# Patient Record
Sex: Male | Born: 1950 | Race: Black or African American | Hispanic: No | Marital: Single | Smoking: Former smoker
Health system: Southern US, Community
[De-identification: ages and names within clinical notes are randomized; demographics above are authoritative.]

## PROBLEM LIST (undated history)

## (undated) DIAGNOSIS — N529 Male erectile dysfunction, unspecified: Secondary | ICD-10-CM

## (undated) DIAGNOSIS — F32A Depression, unspecified: Secondary | ICD-10-CM

## (undated) DIAGNOSIS — K219 Gastro-esophageal reflux disease without esophagitis: Secondary | ICD-10-CM

## (undated) DIAGNOSIS — I10 Essential (primary) hypertension: Secondary | ICD-10-CM

## (undated) DIAGNOSIS — Z720 Tobacco use: Secondary | ICD-10-CM

## (undated) DIAGNOSIS — E785 Hyperlipidemia, unspecified: Secondary | ICD-10-CM

## (undated) DIAGNOSIS — F1021 Alcohol dependence, in remission: Secondary | ICD-10-CM

## (undated) DIAGNOSIS — M1990 Unspecified osteoarthritis, unspecified site: Secondary | ICD-10-CM

## (undated) DIAGNOSIS — K429 Umbilical hernia without obstruction or gangrene: Secondary | ICD-10-CM

## (undated) HISTORY — DX: Unspecified osteoarthritis, unspecified site: M19.90

## (undated) HISTORY — PX: BRAIN SURGERY: SHX531

## (undated) HISTORY — DX: Alcohol dependence, in remission: F10.21

## (undated) HISTORY — DX: Hyperlipidemia, unspecified: E78.5

## (undated) HISTORY — DX: Umbilical hernia without obstruction or gangrene: K42.9

## (undated) HISTORY — DX: Male erectile dysfunction, unspecified: N52.9

## (undated) HISTORY — DX: Depression, unspecified: F32.A

## (undated) HISTORY — DX: Tobacco use: Z72.0

---

## 1983-01-06 DIAGNOSIS — Z87898 Personal history of other specified conditions: Secondary | ICD-10-CM

## 1983-01-06 DIAGNOSIS — R569 Unspecified convulsions: Secondary | ICD-10-CM

## 1983-01-06 HISTORY — DX: Unspecified convulsions: R56.9

## 2004-01-30 ENCOUNTER — Emergency Department: Payer: Self-pay | Admitting: Emergency Medicine

## 2005-06-01 ENCOUNTER — Emergency Department: Payer: Self-pay | Admitting: Emergency Medicine

## 2005-10-08 ENCOUNTER — Emergency Department: Payer: Self-pay | Admitting: Emergency Medicine

## 2006-05-03 ENCOUNTER — Emergency Department: Payer: Self-pay | Admitting: Unknown Physician Specialty

## 2007-04-18 ENCOUNTER — Other Ambulatory Visit: Payer: Self-pay

## 2007-04-18 ENCOUNTER — Emergency Department: Payer: Self-pay | Admitting: Emergency Medicine

## 2008-02-01 ENCOUNTER — Emergency Department: Payer: Self-pay | Admitting: Internal Medicine

## 2008-11-20 ENCOUNTER — Emergency Department: Payer: Self-pay | Admitting: Emergency Medicine

## 2012-11-24 ENCOUNTER — Emergency Department: Payer: Self-pay | Admitting: Emergency Medicine

## 2012-11-24 LAB — COMPREHENSIVE METABOLIC PANEL
Albumin: 3.6 g/dL (ref 3.4–5.0)
Alkaline Phosphatase: 110 U/L (ref 50–136)
Anion Gap: 5 — ABNORMAL LOW (ref 7–16)
BUN: 31 mg/dL — ABNORMAL HIGH (ref 7–18)
Bilirubin,Total: 0.3 mg/dL (ref 0.2–1.0)
Calcium, Total: 9.1 mg/dL (ref 8.5–10.1)
Chloride: 93 mmol/L — ABNORMAL LOW (ref 98–107)
Co2: 32 mmol/L (ref 21–32)
Creatinine: 1.62 mg/dL — ABNORMAL HIGH (ref 0.60–1.30)
EGFR (African American): 52 — ABNORMAL LOW
EGFR (Non-African Amer.): 45 — ABNORMAL LOW
Glucose: 110 mg/dL — ABNORMAL HIGH (ref 65–99)
Osmolality: 268 (ref 275–301)
Potassium: 3 mmol/L — ABNORMAL LOW (ref 3.5–5.1)
SGOT(AST): 28 U/L (ref 15–37)
SGPT (ALT): 23 U/L (ref 12–78)
Sodium: 130 mmol/L — ABNORMAL LOW (ref 136–145)
Total Protein: 8.3 g/dL — ABNORMAL HIGH (ref 6.4–8.2)

## 2012-11-24 LAB — CBC WITH DIFFERENTIAL/PLATELET
Basophil #: 0.1 10*3/uL (ref 0.0–0.1)
Basophil %: 0.8 %
Eosinophil #: 0.3 10*3/uL (ref 0.0–0.7)
Eosinophil %: 4 %
HCT: 43.2 % (ref 40.0–52.0)
HGB: 15.2 g/dL (ref 13.0–18.0)
Lymphocyte #: 1.5 10*3/uL (ref 1.0–3.6)
Lymphocyte %: 23 %
MCH: 33 pg (ref 26.0–34.0)
MCHC: 35.2 g/dL (ref 32.0–36.0)
MCV: 94 fL (ref 80–100)
Monocyte #: 1 x10 3/mm (ref 0.2–1.0)
Monocyte %: 14.9 %
Neutrophil #: 3.8 10*3/uL (ref 1.4–6.5)
Neutrophil %: 57.3 %
Platelet: 318 10*3/uL (ref 150–440)
RBC: 4.6 10*6/uL (ref 4.40–5.90)
RDW: 13 % (ref 11.5–14.5)
WBC: 6.7 10*3/uL (ref 3.8–10.6)

## 2012-11-24 LAB — LIPASE, BLOOD: Lipase: 357 U/L (ref 73–393)

## 2013-09-08 ENCOUNTER — Emergency Department: Payer: Self-pay | Admitting: Emergency Medicine

## 2016-07-16 ENCOUNTER — Encounter: Payer: Self-pay | Admitting: Emergency Medicine

## 2016-07-16 ENCOUNTER — Emergency Department
Admission: EM | Admit: 2016-07-16 | Discharge: 2016-07-16 | Disposition: A | Discharge status: Home / Self Care | Payer: Medicare Other | Attending: Emergency Medicine | Admitting: Emergency Medicine

## 2016-07-16 DIAGNOSIS — M545 Low back pain: Secondary | ICD-10-CM | POA: Diagnosis present

## 2016-07-16 DIAGNOSIS — I10 Essential (primary) hypertension: Secondary | ICD-10-CM | POA: Diagnosis not present

## 2016-07-16 DIAGNOSIS — M5441 Lumbago with sciatica, right side: Secondary | ICD-10-CM | POA: Insufficient documentation

## 2016-07-16 DIAGNOSIS — M5431 Sciatica, right side: Secondary | ICD-10-CM

## 2016-07-16 HISTORY — DX: Essential (primary) hypertension: I10

## 2016-07-16 HISTORY — DX: Gastro-esophageal reflux disease without esophagitis: K21.9

## 2016-07-16 MED ORDER — PREDNISONE 10 MG PO TABS: ORAL_TABLET | ORAL | 0 refills | Status: DC

## 2016-07-16 MED ORDER — TRAMADOL HCL 50 MG PO TABS: 50.0000 mg | ORAL_TABLET | Freq: Four times a day (QID) | ORAL | 0 refills | Status: AC | PRN

## 2016-07-16 NOTE — ED Triage Notes (Signed)
Pt reports right side low back pain that radiates down right leg for six days. Pt ambulatory to triage. Denies NVD. Denies dysuria.

## 2016-07-16 NOTE — Discharge Instructions (Signed)
Have blood pressure rechecked at [facility].   Begin taking prednisone as directed.  Tramadol as needed for moderate to severe pain.  Do not drive while taking Tramadol.  You may also use ice or heat to your back for comfort

## 2016-07-16 NOTE — ED Provider Notes (Signed)
[facility] Emergency Department Provider Note   ____________________________________________   First MD Initiated Contact with Patient 07/16/16 1659     (approximate)  I have reviewed the triage vital signs and the nursing notes.   HISTORY  Chief Complaint Back Pain    HPI Drew Mendoza is a 66 y.o. male is here with complaint of right-sided low back pain that radiates down into his right leg for the last 6 days. Patient states he has had problems with his back in the past but has never been diagnosed with sciatica. He denies any urinary symptoms, incontinence of bowel or bladder, saddle anesthesias. Patient has not been taking any over-the-counter medication. She did not go to work today due to his right leg pain. He rates his pain as 7 out of 10.   Past Medical History:  Diagnosis Date  . GERD (gastroesophageal reflux disease)   . Hypertension     There are no active problems to display for this patient.   No past surgical history on file.  Prior to Admission medications   Medication Sig Start Date End Date Taking? Authorizing Provider  predniSONE (DELTASONE) 10 MG tablet Take 6 tablets  today, on day 2 take 5 tablets, day 3 take 4 tablets, day 4 take 3 tablets, day 5 take  2 tablets and 1 tablet the last day 07/16/16   Tommi Rumps, PA-C  traMADol (ULTRAM) 50 MG tablet Take 1 tablet (50 mg total) by mouth every 6 (six) hours as needed. 07/16/16 07/16/17  Tommi Rumps, PA-C    Allergies Patient has no known allergies.  No family history on file.  Social History Social History  Substance Use Topics  . Smoking status: Not on file  . Smokeless tobacco: Not on file  . Alcohol use Not on file    Review of Systems Constitutional: No fever/chills Cardiovascular: Denies chest pain. Respiratory: Denies shortness of breath. Gastrointestinal: No abdominal pain.  No nausea, no vomiting. Genitourinary: Negative for  dysuria. Musculoskeletal: Positive for right lower back pain. Positive for radiculopathy right leg. Skin: Negative for rash. Neurological: Negative for headaches, focal weakness or numbness.   ____________________________________________   PHYSICAL EXAM:  VITAL SIGNS: ED Triage Vitals [07/16/16 1634]  Enc Vitals Group     BP (!) 142/101     Pulse Rate 82     Resp 18     Temp 98.1 F (36.7 C)     Temp Source Oral     SpO2 97 %     Weight 140 lb (63.5 kg)     Height 5\' 9"  (1.753 m)     Head Circumference      Peak Flow      Pain Score 7     Pain Loc      Pain Edu?      Excl. in GC?     Constitutional: Alert and oriented. Well appearing and in no acute distress. Eyes: Conjunctivae are normal.  Head: Atraumatic. Neck: No stridor.   Cardiovascular: Normal rate, regular rhythm. Grossly normal heart sounds.  Good peripheral circulation. Respiratory: Normal respiratory effort.  No retractions. Lungs CTAB. Gastrointestinal: Soft and nontender. No distention. Musculoskeletal: On examination back there is no gross deformity noted. There is no tenderness on palpation of the vertebral bodies of the lumbar spine. There is moderate tenderness on palpation of the right SI joint area. Straight leg raises were negative. Normal gait was noted. Neurologic:  Normal speech and language. No  gross focal neurologic deficits are appreciated. No gait instability. Reflexes were 1+ bilaterally. Skin:  Skin is warm, dry and intact. No rash noted. Psychiatric: Mood and affect are normal. Speech and behavior are normal.  ____________________________________________   LABS (all labs ordered are listed, but only abnormal results are displayed)  Labs Reviewed - No data to display  PROCEDURES  Procedure(s) performed: None  Procedures  Critical Care performed: No  ____________________________________________   INITIAL IMPRESSION / ASSESSMENT AND PLAN / ED COURSE  Pertinent labs & imaging  results that were available during my care of the patient were reviewed by me and considered in my medical decision making (see chart for details).  Patient's blood pressure was elevated and follow in the department. Patient states that he has not taken his blood pressure medication in approximately 1 month. He [facility] all [facility]. He is encouraged to [facility] and get his medication refilled. He was given a prescription for prednisone 60 a taper along with tramadol if needed for severe pain.      ____________________________________________   FINAL CLINICAL IMPRESSION(S) / ED DIAGNOSES  Final diagnoses:  Sciatica of right side      NEW MEDICATIONS STARTED DURING THIS VISIT:  Discharge Medication List as of 07/16/2016  5:30 PM    START taking these medications   Details  predniSONE (DELTASONE) 10 MG tablet Take 6 tablets  today, on day 2 take 5 tablets, day 3 take 4 tablets, day 4 take 3 tablets, day 5 take  2 tablets and 1 tablet the last day, Print    traMADol (ULTRAM) 50 MG tablet Take 1 tablet (50 mg total) by mouth every 6 (six) hours as needed., Starting Thu 07/16/2016, Until Fri 07/16/2017, Print         Note:  This document was prepared using Dragon voice recognition software and may include unintentional dictation errors.    Tommi RumpsSummers, Sequoyah Ramone L, PA-C 07/16/16 1743    Charlynne PanderYao, David Hsienta, MD 07/16/16 2053

## 2018-08-23 ENCOUNTER — Other Ambulatory Visit: Payer: Self-pay

## 2018-08-23 DIAGNOSIS — Z20822 Contact with and (suspected) exposure to covid-19: Secondary | ICD-10-CM

## 2018-08-25 LAB — NOVEL CORONAVIRUS, NAA: SARS-CoV-2, NAA: NOT DETECTED

## 2019-02-07 ENCOUNTER — Emergency Department: Payer: No Typology Code available for payment source

## 2019-02-07 ENCOUNTER — Emergency Department
Admission: EM | Admit: 2019-02-07 | Discharge: 2019-02-07 | Disposition: A | Discharge status: Home / Self Care | Payer: No Typology Code available for payment source | Attending: Emergency Medicine | Admitting: Emergency Medicine

## 2019-02-07 ENCOUNTER — Encounter: Payer: Self-pay | Admitting: Emergency Medicine

## 2019-02-07 ENCOUNTER — Other Ambulatory Visit: Payer: Self-pay

## 2019-02-07 DIAGNOSIS — W06XXXA Fall from bed, initial encounter: Secondary | ICD-10-CM | POA: Diagnosis not present

## 2019-02-07 DIAGNOSIS — F17200 Nicotine dependence, unspecified, uncomplicated: Secondary | ICD-10-CM | POA: Insufficient documentation

## 2019-02-07 DIAGNOSIS — I10 Essential (primary) hypertension: Secondary | ICD-10-CM | POA: Insufficient documentation

## 2019-02-07 DIAGNOSIS — S0003XA Contusion of scalp, initial encounter: Secondary | ICD-10-CM

## 2019-02-07 DIAGNOSIS — S42032A Displaced fracture of lateral end of left clavicle, initial encounter for closed fracture: Secondary | ICD-10-CM | POA: Insufficient documentation

## 2019-02-07 DIAGNOSIS — Y999 Unspecified external cause status: Secondary | ICD-10-CM | POA: Insufficient documentation

## 2019-02-07 DIAGNOSIS — Y939 Activity, unspecified: Secondary | ICD-10-CM | POA: Diagnosis not present

## 2019-02-07 DIAGNOSIS — Y92003 Bedroom of unspecified non-institutional (private) residence as the place of occurrence of the external cause: Secondary | ICD-10-CM | POA: Diagnosis not present

## 2019-02-07 DIAGNOSIS — S0990XA Unspecified injury of head, initial encounter: Secondary | ICD-10-CM | POA: Diagnosis not present

## 2019-02-07 MED ORDER — TRAMADOL HCL 50 MG PO TABS: 50.0000 mg | ORAL_TABLET | Freq: Four times a day (QID) | ORAL | 0 refills | Status: DC | PRN

## 2019-02-07 NOTE — ED Provider Notes (Signed)
[facility] Emergency Department Provider Note   ____________________________________________   First MD Initiated Contact with Patient 02/07/19 [phone]     (approximate)  I have reviewed the triage vital signs and the nursing notes.   HISTORY  Chief Complaint Shoulder Injury   HPI Drew Mendoza is a 69 y.o. male presents to the ED with complaint of left shoulder pain and injury to his head.  Patient states that he fell out of bed 2 nights ago.  This was unwitnessed however he states his daughter was there and heard him fall.  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  He denies any loss of consciousness or vision difficulty.  He has continued to have pain in his left shoulder.  Patient denies any blood thinners or daily aspirin therapy.  Patient has a history of brain surgery in the 1980s.  He rates his pain as an 8 out of 10.      Past Medical History:  Diagnosis Date  . GERD (gastroesophageal reflux disease)   . Hypertension     There are no problems to display for this patient.   Past Surgical History:  Procedure Laterality Date  . BRAIN SURGERY      Prior to Admission medications   Medication Sig Start Date End Date Taking? Authorizing Provider  traMADol (ULTRAM) 50 MG tablet Take 1 tablet (50 mg total) by mouth every 6 (six) hours as needed. 02/07/19   Tommi Rumps, PA-C    Allergies Patient has no known allergies.  No family history on file.  Social History Social History   Tobacco Use  . Smoking status: Current Every Day Smoker  . Smokeless tobacco: Never Used  Substance Use Topics  . Alcohol use: Yes    Comment: rarely  . Drug use: Not on file    Review of Systems Constitutional: No fever/chills Eyes: No visual changes. ENT: No trauma. Cardiovascular: Denies chest pain. Respiratory: Denies shortness of breath. Gastrointestinal: No abdominal  pain.  No nausea, no vomiting.  No diarrhea.  No constipation. Musculoskeletal: Positive for pain left shoulder. Skin: Negative for rash. Neurological: Positive mild headaches, no focal weakness or numbness. ____________________________________________   PHYSICAL EXAM:  VITAL SIGNS: ED Triage Vitals  Enc Vitals Group     BP 02/07/19 0812 140/86     Pulse Rate 02/07/19 0812 88     Resp 02/07/19 0812 18     Temp 02/07/19 0812 (!) 97.4 F (36.3 C)     Temp Source 02/07/19 0812 Oral     SpO2 02/07/19 0812 98 %     Weight 02/07/19 0815 149 lb (67.6 kg)     Height 02/07/19 0815 5\' 9"  (1.753 m)     Head Circumference --      Peak Flow --      Pain Score 02/07/19 0815 8     Pain Loc --      Pain Edu? --      Excl. in GC? --     Constitutional: Alert and oriented. Well appearing and in no acute distress. Eyes: Conjunctivae are normal. PERRL. EOMI. Head: Atraumatic. Nose: No trauma. Neck: No stridor.  Nontender cervical spine post to palpation.  Range of motion is without restriction. Cardiovascular: Normal rate, regular rhythm. Grossly normal heart sounds.  Good peripheral circulation. Respiratory: Normal respiratory effort.  No retractions. Lungs CTAB. Gastrointestinal: Soft and nontender.  No distention. Musculoskeletal: No gross deformities noted of the left shoulder however patient is moderately tender to palpation on the distal portion of his clavicle.  Also range of motion of left shoulder is restricted secondary to pain.  No soft tissue edema or discoloration is noted.  Good muscle strength bilateral hands.  No tenderness noted to the left elbow or wrist.  Patient also has no tenderness or difficulty with lower extremities. Neurologic:  Normal speech and language. No gross focal neurologic deficits are appreciated. No gait instability. Skin:  Skin is warm, dry and intact.  There is a well-healed surgical scar upper forehead from his previous brain surgery.  No acute skin injury is  noted. Psychiatric: Mood and affect are normal. Speech and behavior are normal.  ____________________________________________   LABS (all labs ordered are listed, but only abnormal results are displayed)  Labs Reviewed - No data to display  RADIOLOGY  Official radiology report(s): CT Head Wo Contrast  Result Date: 02/07/2019 CLINICAL DATA:  Fall from bed, history of brain surgery EXAM: CT HEAD WITHOUT CONTRAST TECHNIQUE: Contiguous axial images were obtained from the base of the skull through the vertex without intravenous contrast. COMPARISON:  None. FINDINGS: Brain: There is no acute intracranial hemorrhage, mass-effect, or edema. There is no acute appearing loss of gray differentiation. Left frontal encephalomalacia is noted. Patchy and confluent hypoattenuation in the supratentorial white matter is nonspecific but may reflect moderate chronic microvascular ischemic changes. There is no extra-axial fluid collection. Ventricles and sulci are within normal limits in size and configuration. Vascular: There is mild atherosclerotic calcification at the skull base. Skull: Bifrontal craniotomy. Sinuses/Orbits: Mild mucosal thickening.  Orbits are unremarkable. Other: Mastoid air cells are clear. Presumed cerumen within bilateral external auditory canals. IMPRESSION: No evidence of acute intracranial injury. Chronic/nonemergent findings detailed above. Electronically Signed   By: Guadlupe Spanish M.D.   On: 02/07/2019 08:54   DG Shoulder Left  Result Date: 02/07/2019 CLINICAL DATA:  Fall out of bed EXAM: LEFT SHOULDER - 2+ VIEW COMPARISON:  None. FINDINGS: Mildly displaced fracture of the distal left clavicle. There is anatomic alignment at the glenohumeral joint. Joint spaces are preserved. IMPRESSION: Mildly displaced fracture of the distal left clavicle. Electronically Signed   By: Guadlupe Spanish M.D.   On: 02/07/2019 08:45     ____________________________________________   PROCEDURES  Procedure(s) performed (including Critical Care):  Procedures   ____________________________________________   INITIAL IMPRESSION / ASSESSMENT AND PLAN / ED COURSE  As part of my medical decision making, I reviewed the following data within the electronic MEDICAL RECORD NUMBER Notes from prior ED visits and [place] Controlled Substance Database  ASEEM SESSUMS was evaluated in Emergency Department on 02/07/2019 for the symptoms described in the history of present illness. He was evaluated in the context of the global COVID-19 pandemic, which necessitated consideration that the patient might be at risk for infection with the SARS-CoV-2 virus that causes COVID-19. Institutional protocols and algorithms that pertain to the evaluation of patients at risk for COVID-19 are in a state of rapid change based on information released by regulatory bodies including the CDC and federal and state organizations. These policies and algorithms were followed during the patient's care in the ED.   69 year old male presents to the ED with complaint of head contusion and left shoulder pain after he fell out of bed 2 days ago.  Patient states that this was because of bad dreams secondary to being in the [employer].  He denies any  loss of consciousness however this was an unwitnessed event.  Physical exam is suspicious for a shoulder injury.  X-rays confirmed that he does have a distal clavicle fracture and patient was made aware.  His CT scan was negative for any acute changes.  Patient was given a prescription for tramadol.  He states that he will go to the [facility] hospital for follow-up on his clavicle fracture.  He was placed in a sling and was ambulatory at the time of discharge.  ____________________________________________   FINAL CLINICAL IMPRESSION(S) / ED DIAGNOSES  Final diagnoses:  Closed displaced fracture of acromial end of left clavicle, initial  encounter  Contusion of scalp, initial encounter     ED Discharge Orders         Ordered    traMADol (ULTRAM) 50 MG tablet  Every 6 hours PRN     02/07/19 1694           Note:  This document was prepared using Dragon voice recognition software and may include unintentional dictation errors.    Johnn Hai, PA-C 02/07/19 1107    Lavonia Drafts, MD 02/07/19 [phone]

## 2019-02-07 NOTE — ED Notes (Signed)
See triage note  States he had a "bad 'dream and fell from bed  He landed on left shoulder    States he also hit his head on hardwood floor  Having increased pain with movement of arm

## 2019-02-07 NOTE — Discharge Instructions (Addendum)
Call make an appointment with one of the orthopedist at the [facility] for follow-up of your clavicle fracture.  The sling is provided for support.  You do not need to wear this while you are sleeping.  You may apply ice to the area as needed for swelling or pain.  Also take tramadol every 6 hours if needed for pain.  You may take Tylenol with t with the tramadol if additional pain medication is needed.  CT of your head did not show any acute brain injury.  Return to the emergency department if any urgent concerns.

## 2019-02-07 NOTE — ED Triage Notes (Signed)
Presents s/p fall from bed 2 nights ago  Having pain to left shoulder and also hit his head   No LOC

## 2020-08-02 IMAGING — CR DG SHOULDER 2+V*L*
1 series · 3 of 3 positions shown · non-contrast
Comparison: None.

CLINICAL DATA: Fall out of bed

EXAM:
LEFT SHOULDER - 2+ VIEW

[Series 1: dg shoulder left · 0.14mm/px · 3 of 3 slices shown]
[im 1/3]
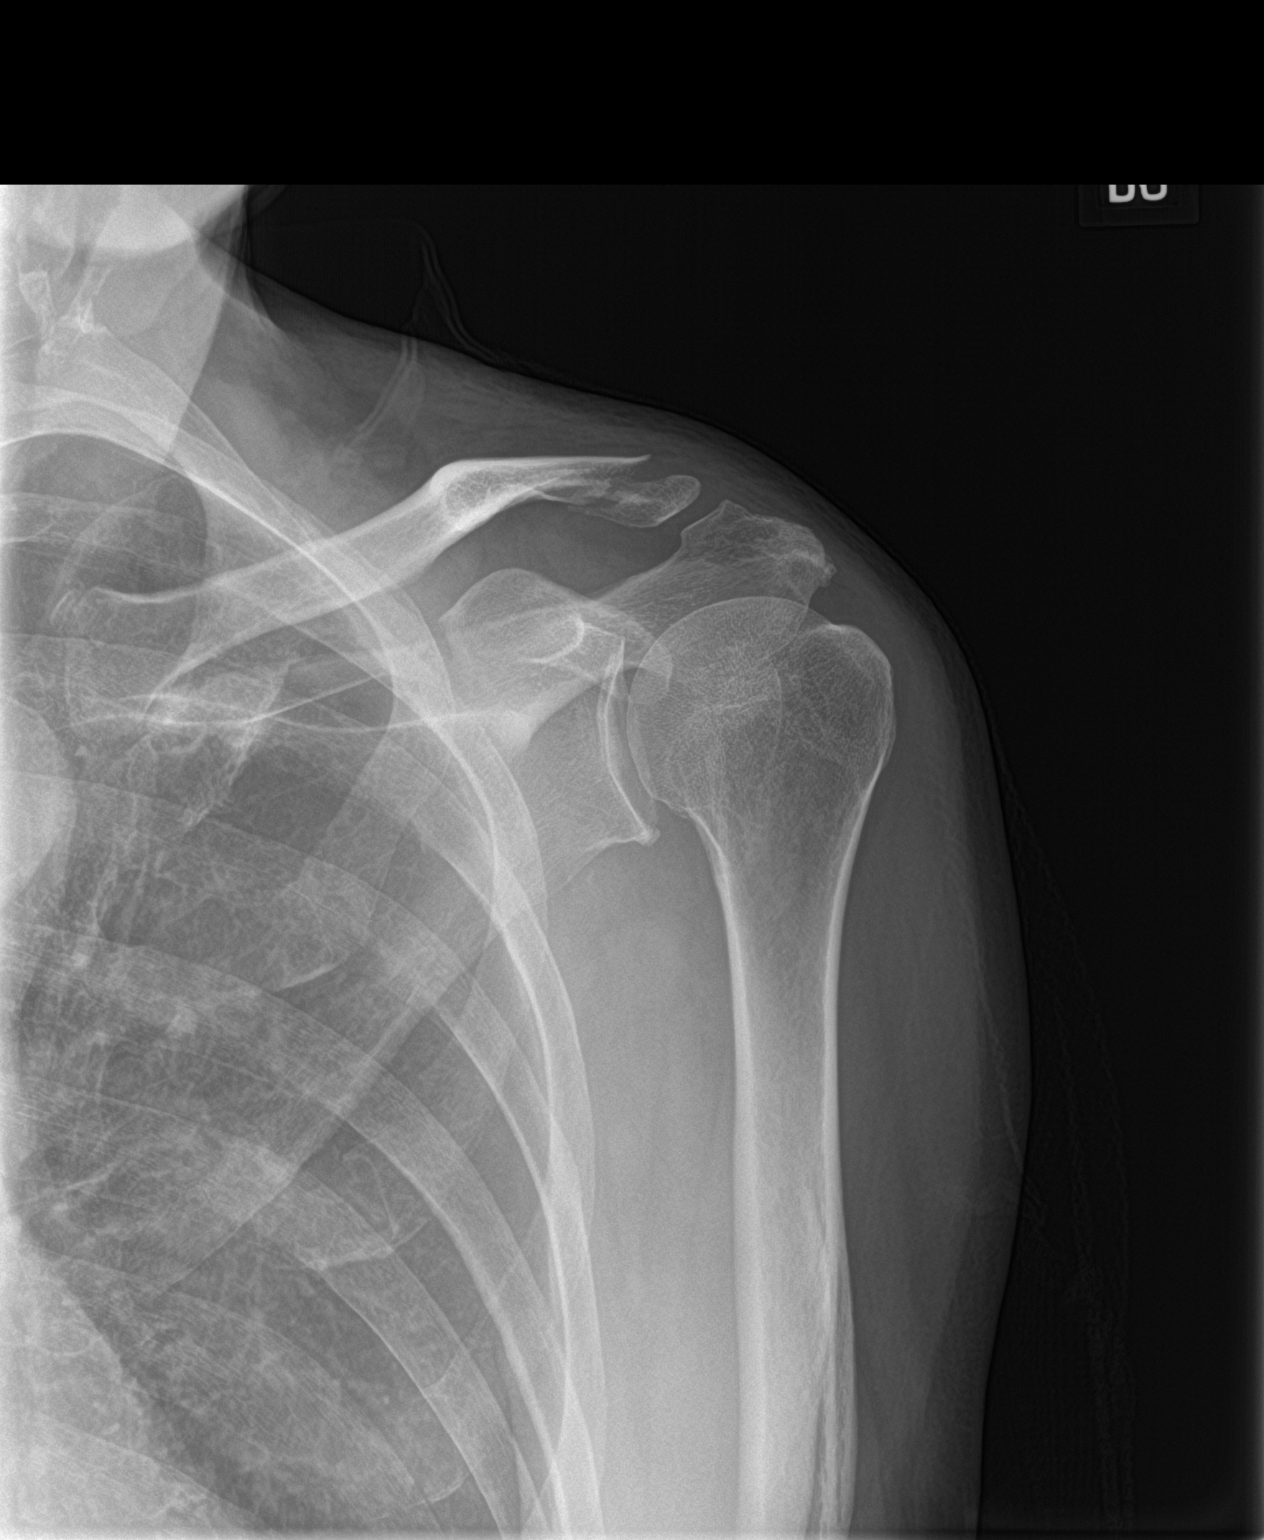
[im 2/3]
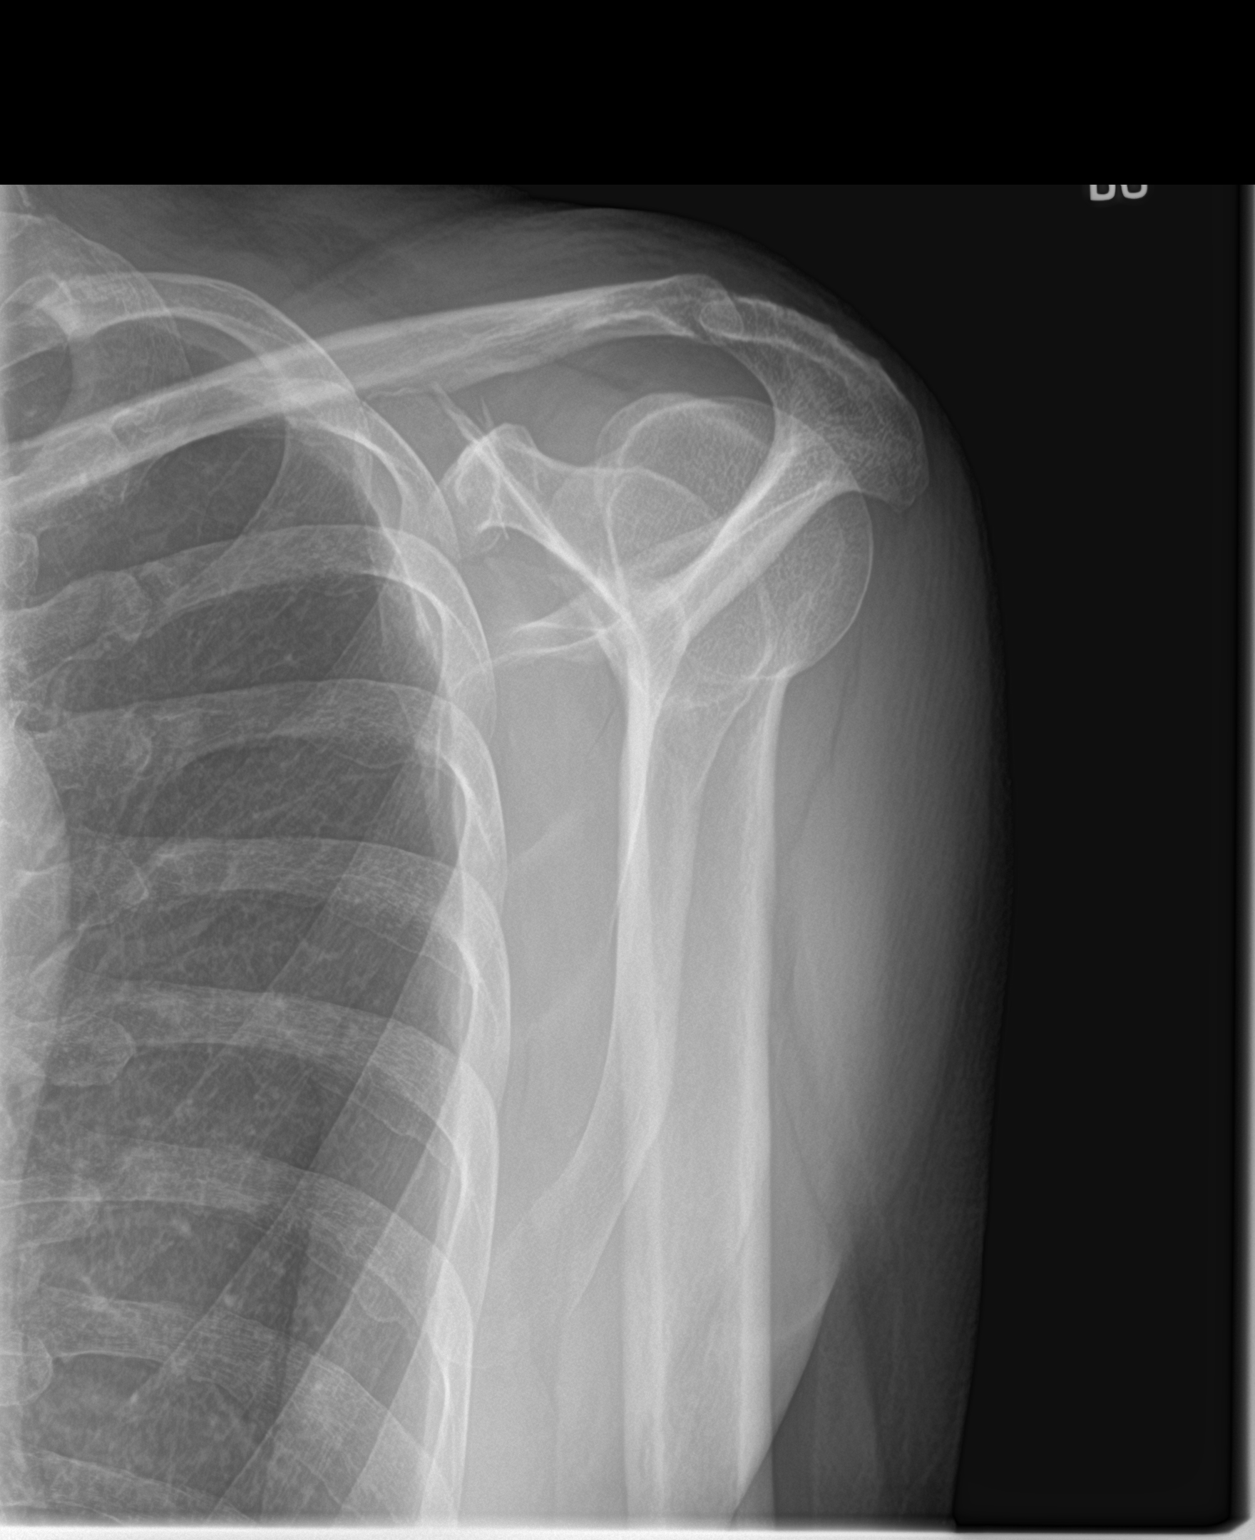
[im 3/3]
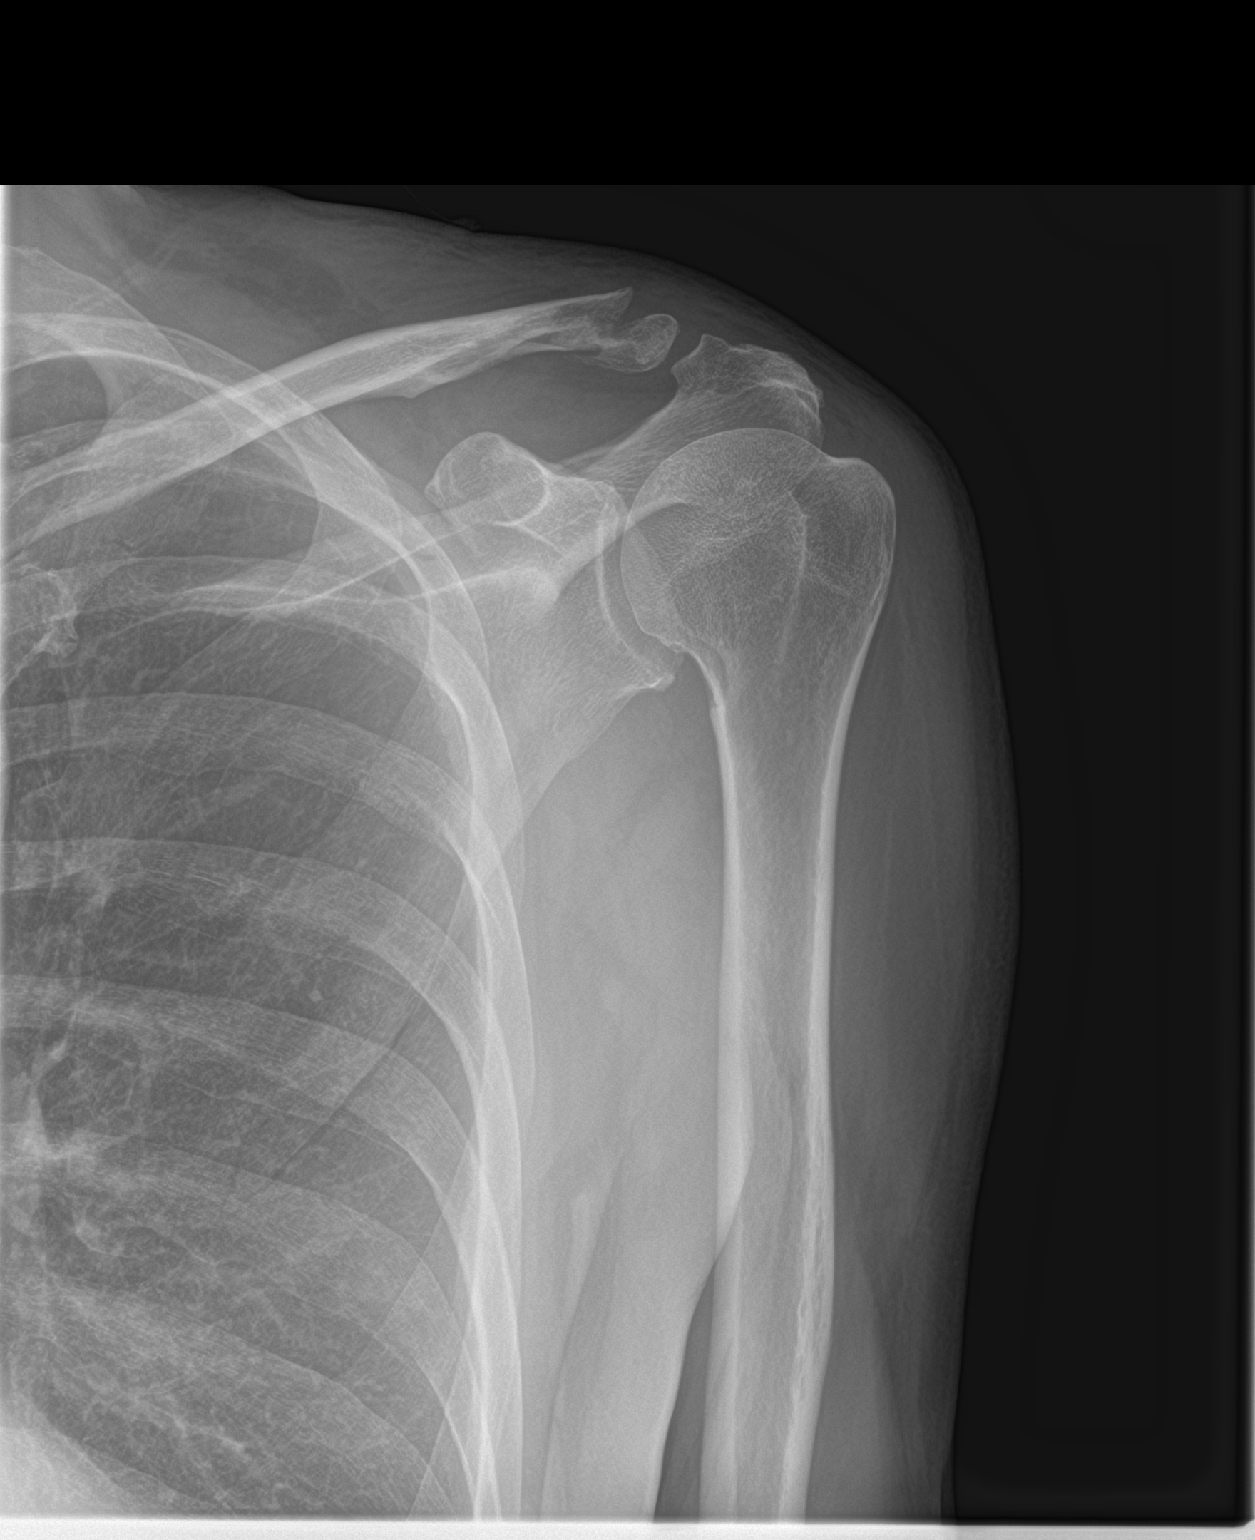

[3 of 3 positions shown; findings below may reference images not displayed]

FINDINGS: Mildly displaced fracture of the distal left clavicle. There is
anatomic alignment at the glenohumeral joint. Joint spaces are
preserved.
IMPRESSION: Mildly displaced fracture of the distal left clavicle.

## 2021-09-12 DIAGNOSIS — H25042 Posterior subcapsular polar age-related cataract, left eye: Secondary | ICD-10-CM | POA: Diagnosis not present

## 2021-10-01 DIAGNOSIS — H2511 Age-related nuclear cataract, right eye: Secondary | ICD-10-CM | POA: Diagnosis not present

## 2021-10-23 ENCOUNTER — Encounter: Payer: Self-pay | Admitting: Ophthalmology

## 2021-10-27 NOTE — Discharge Instructions (Signed)

## 2021-10-28 ENCOUNTER — Encounter: Payer: Self-pay | Admitting: Ophthalmology

## 2021-10-28 ENCOUNTER — Other Ambulatory Visit: Payer: Self-pay

## 2021-10-28 ENCOUNTER — Ambulatory Visit
Admission: RE | Admit: 2021-10-28 | Discharge: 2021-10-28 | Disposition: A | Discharge status: Home / Self Care | Payer: Medicare Other | Attending: Ophthalmology | Admitting: Ophthalmology

## 2021-10-28 ENCOUNTER — Ambulatory Visit: Payer: Medicare Other | Admitting: Anesthesiology

## 2021-10-28 ENCOUNTER — Encounter
Admission: RE | Disposition: A | Discharge status: Home / Self Care | Payer: Self-pay | Source: Home / Self Care | Attending: Ophthalmology

## 2021-10-28 DIAGNOSIS — H2511 Age-related nuclear cataract, right eye: Secondary | ICD-10-CM | POA: Insufficient documentation

## 2021-10-28 DIAGNOSIS — I10 Essential (primary) hypertension: Secondary | ICD-10-CM | POA: Diagnosis not present

## 2021-10-28 DIAGNOSIS — Z79899 Other long term (current) drug therapy: Secondary | ICD-10-CM | POA: Insufficient documentation

## 2021-10-28 DIAGNOSIS — Z87891 Personal history of nicotine dependence: Secondary | ICD-10-CM | POA: Diagnosis not present

## 2021-10-28 DIAGNOSIS — H269 Unspecified cataract: Secondary | ICD-10-CM | POA: Diagnosis not present

## 2021-10-28 DIAGNOSIS — K219 Gastro-esophageal reflux disease without esophagitis: Secondary | ICD-10-CM | POA: Insufficient documentation

## 2021-10-28 DIAGNOSIS — H2589 Other age-related cataract: Secondary | ICD-10-CM | POA: Diagnosis not present

## 2021-10-28 HISTORY — PX: CATARACT EXTRACTION W/PHACO: SHX586

## 2021-10-28 SURGERY — PHACOEMULSIFICATION, CATARACT, WITH IOL INSERTION
Anesthesia: Monitor Anesthesia Care | Site: Eye | Laterality: Right

## 2021-10-28 MED ORDER — TETRACAINE HCL 0.5 % OP SOLN
1.0000 [drp] | OPHTHALMIC | Status: DC | PRN
  Administered 2021-10-28 (×3): 1 [drp] via OPHTHALMIC

## 2021-10-28 MED ORDER — SIGHTPATH DOSE#1 BSS IO SOLN
INTRAOCULAR | Status: DC | PRN
  Administered 2021-10-28: 1 mL via INTRAMUSCULAR

## 2021-10-28 MED ORDER — TRYPAN BLUE 0.06 % IO SOSY
PREFILLED_SYRINGE | INTRAOCULAR | Status: DC | PRN
  Administered 2021-10-28: .2 mL via INTRAOCULAR

## 2021-10-28 MED ORDER — MIDAZOLAM HCL 2 MG/2ML IJ SOLN
INTRAMUSCULAR | Status: DC | PRN
  Administered 2021-10-28 (×2): 1 mg via INTRAVENOUS

## 2021-10-28 MED ORDER — ARMC OPHTHALMIC DILATING DROPS
1.0000 | OPHTHALMIC | Status: DC | PRN
  Administered 2021-10-28 (×3): 1 via OPHTHALMIC

## 2021-10-28 MED ORDER — EPHEDRINE SULFATE (PRESSORS) 50 MG/ML IJ SOLN
INTRAMUSCULAR | Status: DC | PRN
  Administered 2021-10-28: 10 mg via INTRAVENOUS
  Administered 2021-10-28: 5 mg via INTRAVENOUS

## 2021-10-28 MED ORDER — PHENYLEPHRINE HCL (PRESSORS) 10 MG/ML IV SOLN
INTRAVENOUS | Status: DC | PRN
  Administered 2021-10-28: 80 ug via INTRAVENOUS

## 2021-10-28 MED ORDER — MOXIFLOXACIN HCL 0.5 % OP SOLN
OPHTHALMIC | Status: DC | PRN
  Administered 2021-10-28: 0.2 mL via OPHTHALMIC

## 2021-10-28 MED ORDER — LACTATED RINGERS IV SOLN: INTRAVENOUS | Status: DC

## 2021-10-28 MED ORDER — SIGHTPATH DOSE#1 BSS IO SOLN
INTRAOCULAR | Status: DC | PRN
  Administered 2021-10-28: 15 mL

## 2021-10-28 MED ORDER — PROPOFOL 500 MG/50ML IV EMUL
INTRAVENOUS | Status: DC | PRN
  Administered 2021-10-28: 150 mg via INTRAVENOUS

## 2021-10-28 MED ORDER — BRIMONIDINE TARTRATE-TIMOLOL 0.2-0.5 % OP SOLN
OPHTHALMIC | Status: DC | PRN
  Administered 2021-10-28: 1 [drp] via OPHTHALMIC

## 2021-10-28 MED ORDER — SODIUM CHLORIDE 0.9 % IV SOLN: INTRAVENOUS | Status: DC | PRN

## 2021-10-28 MED ORDER — SIGHTPATH DOSE#1 BSS IO SOLN
INTRAOCULAR | Status: DC | PRN
  Administered 2021-10-28: 78 mL via OPHTHALMIC

## 2021-10-28 MED ORDER — FENTANYL CITRATE (PF) 100 MCG/2ML IJ SOLN
INTRAMUSCULAR | Status: DC | PRN
  Administered 2021-10-28 (×2): 25 ug via INTRAVENOUS
  Administered 2021-10-28: 50 ug via INTRAVENOUS

## 2021-10-28 MED ORDER — SIGHTPATH DOSE#1 NA CHONDROIT SULF-NA HYALURON 40-17 MG/ML IO SOLN
INTRAOCULAR | Status: DC | PRN
  Administered 2021-10-28: 1 mL via INTRAOCULAR

## 2021-10-28 SURGICAL SUPPLY — 19 items
APL SWBSTK 6 STRL LF DISP (MISCELLANEOUS) ×3
APPLICATOR COTTON TIP 6 STRL (MISCELLANEOUS) IMPLANT
APPLICATOR COTTON TIP 6IN STRL (MISCELLANEOUS) ×3
CANNULA ANT/CHMB 27G (MISCELLANEOUS) IMPLANT
CANNULA ANT/CHMB 27GA (MISCELLANEOUS) ×1 IMPLANT
CATARACT SUITE SIGHTPATH (MISCELLANEOUS) ×1 IMPLANT
COVER MAYO STAND STRL (DRAPES) IMPLANT
CUP MEDICINE 2OZ PLAST GRAD ST (MISCELLANEOUS) IMPLANT
DRAPE INCISE 65X100 (DRAPES) IMPLANT
DRSG TEGADERM 4X4.75 (GAUZE/BANDAGES/DRESSINGS) IMPLANT
FEE CATARACT SUITE SIGHTPATH (MISCELLANEOUS) ×1 IMPLANT
GLOVE SURG ENC TEXT LTX SZ8 (GLOVE) ×1 IMPLANT
GLOVE SURG TRIUMPH 8.0 PF LTX (GLOVE) ×1 IMPLANT
LENS IOL TECNIS EYHANCE 18.0 (Intraocular Lens) IMPLANT
NDL FILTER BLUNT 18X1 1/2 (NEEDLE) ×1 IMPLANT
NEEDLE FILTER BLUNT 18X1 1/2 (NEEDLE) ×1 IMPLANT
STRIP CLOSURE SKIN 1/2X4 (GAUZE/BANDAGES/DRESSINGS) IMPLANT
SYR 3ML LL SCALE MARK (SYRINGE) ×1 IMPLANT
WATER STERILE IRR 250ML POUR (IV SOLUTION) ×1 IMPLANT

## 2021-10-28 NOTE — Anesthesia Preprocedure Evaluation (Signed)
Anesthesia Evaluation  Patient identified by MRN, date of birth, ID band Patient awake    Reviewed: Allergy & Precautions, NPO status , Patient's Chart, lab work & pertinent test results  History of Anesthesia Complications Negative for: history of anesthetic complications  Airway Mallampati: III  TM Distance: >3 FB Neck ROM: full    Dental  (+) Dental Advidsory Given   Pulmonary neg pulmonary ROS, neg shortness of breath, neg COPD, Patient abstained from smoking., former smoker,    Pulmonary exam normal        Cardiovascular hypertension, (-) Past MI and (-) CABG negative cardio ROS Normal cardiovascular exam     Neuro/Psych negative neurological ROS  negative psych ROS   GI/Hepatic negative GI ROS, Neg liver ROS,   Endo/Other  negative endocrine ROS  Renal/GU negative Renal ROS  negative genitourinary   Musculoskeletal   Abdominal   Peds  Hematology negative hematology ROS (+)   Anesthesia Other Findings Past Medical History: No date: GERD (gastroesophageal reflux disease) No date: Hypertension 1985: Seizure ([place])     Comment:  none since  Past Surgical History: No date: BRAIN SURGERY  BMI    Body Mass Index: 21.37 kg/m      Reproductive/Obstetrics negative OB ROS                             Anesthesia Physical Anesthesia Plan  ASA: 2  Anesthesia Plan: MAC   Post-op Pain Management:    Induction: Intravenous  PONV Risk Score and Plan: 1 and Propofol infusion and TIVA  Airway Management Planned: Natural Airway and Nasal Cannula  Additional Equipment:   Intra-op Plan:   Post-operative Plan:   Informed Consent: I have reviewed the patients History and Physical, chart, labs and discussed the procedure including the risks, benefits and alternatives for the proposed anesthesia with the patient or authorized representative who has indicated his/her understanding and  acceptance.     Dental Advisory Given  Plan Discussed with: Anesthesiologist, CRNA and Surgeon  Anesthesia Plan Comments: (Patient consented for risks of anesthesia including but not limited to:  - adverse reactions to medications - damage to eyes, teeth, lips or other oral mucosa - nerve damage due to positioning  - sore throat or hoarseness - Damage to heart, brain, nerves, lungs, other parts of body or loss of life  Patient voiced understanding.)        Anesthesia Quick Evaluation

## 2021-10-28 NOTE — H&P (Signed)
Drew Mendoza   Primary Care Physician:  [facility] Ophthalmologist: Dr. George Ina  Pre-Procedure History & Physical: HPI:  Drew Mendoza is a 71 y.o. male here for cataract surgery.   Past Medical History:  Diagnosis Date   GERD (gastroesophageal reflux disease)    Hypertension    Seizure (Drew Mendoza) 1985   none since    Past Surgical History:  Procedure Laterality Date   BRAIN SURGERY      Prior to Admission medications   Medication Sig Start Date End Date Taking? Authorizing Provider  amLODipine (NORVASC) 10 MG tablet Take 10 mg by mouth daily.   Yes [provider]  atorvastatin (LIPITOR) 80 MG tablet Take 80 mg by mouth daily.   Yes [provider]  lisinopril-hydrochlorothiazide (ZESTORETIC) 10-12.5 MG tablet Take 1 tablet by mouth daily.   Yes [provider]  omeprazole (PRILOSEC) 40 MG capsule Take 40 mg by mouth daily.   Yes [provider]    Allergies as of 09/15/2021   (No Known Allergies)    History reviewed. No pertinent family history.  Social History   Socioeconomic History   Marital status: Single    Spouse name: Not on file   Number of children: Not on file   Years of education: Not on file   Highest education level: Not on file  Occupational History   Not on file  Tobacco Use   Smoking status: Former    Types: Cigarettes    Quit date: 10/16/2021    Years since quitting: 0.0   Smokeless tobacco: Never  Vaping Use   Vaping Use: Never used  Substance and Sexual Activity   Alcohol use: Yes    Alcohol/week: 3.0 standard drinks of alcohol    Types: 3 Cans of beer per week    Comment: weekend   Drug use: Never   Sexual activity: Not on file  Other Topics Concern   Not on file  Social History Narrative   Not on file   Social Determinants of Health   Financial Resource Strain: Not on file  Food Insecurity: Not on file  Transportation Needs: Not on file  Physical Activity: Not on file   Stress: Not on file  Social Connections: Not on file  Intimate Partner Violence: Not on file    Review of Systems: See HPI, otherwise negative ROS  Physical Exam: BP 136/80   Pulse 68   Temp 98.4 F (36.9 C) (Temporal)   Resp 14   Ht 5\' 9"  (1.753 m)   Wt 65.6 kg   SpO2 100%   BMI 21.37 kg/m  General:   Alert, cooperative in NAD Head:  Normocephalic and atraumatic. Respiratory:  Normal work of breathing. Cardiovascular:  RRR  Impression/Plan: Drew Mendoza is here for cataract surgery.  Risks, benefits, limitations, and alternatives regarding cataract surgery have been reviewed with the patient.  Questions have been answered.  All parties agreeable.   Birder Robson, MD  10/28/2021, 9:15 AM

## 2021-10-28 NOTE — Anesthesia Postprocedure Evaluation (Signed)
Anesthesia Post Note  Patient: Drew Mendoza  Procedure(s) Performed: CATARACT EXTRACTION PHACO AND INTRAOCULAR LENS PLACEMENT (IOC) RIGHT VISION BLUE 24.17 01:47.4 (Right: Eye)  Patient location during evaluation: PACU Anesthesia Type: MAC Level of consciousness: awake and alert Pain management: pain level controlled Vital Signs Assessment: post-procedure vital signs reviewed and stable Respiratory status: spontaneous breathing, nonlabored ventilation, respiratory function stable and patient connected to nasal cannula oxygen Cardiovascular status: blood pressure returned to baseline and stable Postop Assessment: no apparent nausea or vomiting Anesthetic complications: no   No notable events documented.   Last Vitals:  Vitals:   10/28/21 1007 10/28/21 1012  BP: 103/70 108/74  Pulse: 76 77  Resp: 12 (!) 9  Temp: (!) 36.4 C (!) 36.3 C  SpO2: 98% 96%    Last Pain:  Vitals:   10/28/21 1012  TempSrc:   PainSc: 0-No pain                 Dimas Millin

## 2021-10-28 NOTE — Op Note (Signed)
PREOPERATIVE DIAGNOSIS:  Nuclear sclerotic cataract of the right eye.   POSTOPERATIVE DIAGNOSIS:  H25.89 Cataract   OPERATIVE PROCEDURE:ORPROCALL@   SURGEON:  Birder Robson, MD.   ANESTHESIA:  Anesthesiologist: Dimas Millin, MD CRNA: Jacqualin Combes, CRNA  1.      Managed anesthesia care., converted to G.A. . An LMA placed  due to pts persistent in ability to stay still. 2.      0.81ml of Shugarcaine was instilled in the eye following the paracentesis.   COMPLICATIONS:  Vision Blue was used to stain the anterior capsule due to very poor/ no visualization of the red reflex.    TECHNIQUE:   Stop and chop   DESCRIPTION OF PROCEDURE:  The patient was examined and consented in the preoperative holding area where the aforementioned topical anesthesia was applied to the right eye and then brought back to the Operating Room where the right eye was prepped and draped in the usual sterile ophthalmic fashion and a lid speculum was placed. A paracentesis was created with the side port blade and the anterior chamber was filled with viscoelastic. A near clear corneal incision was performed with the steel keratome. A continuous curvilinear capsulorrhexis was performed with a cystotome followed by the capsulorrhexis forceps. Hydrodissection and hydrodelineation were carried out with BSS on a blunt cannula. The lens was removed in a stop and chop  technique and the remaining cortical material was removed with the irrigation-aspiration handpiece. The capsular bag was inflated with viscoelastic and the Technis [id number]  lens was placed in the capsular bag without complication. The remaining viscoelastic was removed from the eye with the irrigation-aspiration handpiece. The wounds were hydrated. The anterior chamber was flushed with BSS and the eye was inflated to physiologic pressure. 0.46ml of Vigamox was placed in the anterior chamber. The wounds were found to be water tight. The eye was dressed with  Combigan. The patient was given protective glasses to wear throughout the day and a shield with which to sleep tonight. The patient was also given drops with which to begin a drop regimen today and will follow-up with me in one day. Implant Name Type Inv. Item Serial No. Manufacturer Lot No. LRB No. Used Action  LENS IOL TECNIS EYHANCE 18.0 - [id number] Intraocular Lens LENS IOL TECNIS EYHANCE 18.0 [id number] [employer]  Right 1 Implanted   Procedure(s): CATARACT EXTRACTION PHACO AND INTRAOCULAR LENS PLACEMENT (IOC) RIGHT VISION BLUE 24.17 01:47.4 (Right)  Electronically signed: Birder Robson 10/28/2021 10:00 AM

## 2021-10-28 NOTE — Transfer of Care (Signed)
Immediate Anesthesia Transfer of Care Note  Patient: Drew Mendoza  Procedure(s) Performed: CATARACT EXTRACTION PHACO AND INTRAOCULAR LENS PLACEMENT (IOC) RIGHT VISION BLUE 24.17 01:47.4 (Right: Eye)  Patient Location: PACU  Anesthesia Type: MAC  Level of Consciousness: awake, alert  and patient cooperative  Airway and Oxygen Therapy: Patient Spontanous Breathing and Patient connected to supplemental oxygen  Post-op Assessment: Post-op Vital signs reviewed, Patient's Cardiovascular Status Stable, Respiratory Function Stable, Patent Airway and No signs of Nausea or vomiting  Post-op Vital Signs: Reviewed and stable  Complications: No notable events documented.

## 2021-10-29 ENCOUNTER — Encounter: Payer: Self-pay | Admitting: Ophthalmology

## 2021-11-03 ENCOUNTER — Encounter: Payer: Self-pay | Admitting: Ophthalmology

## 2021-11-06 NOTE — Discharge Instructions (Signed)

## 2021-11-11 ENCOUNTER — Encounter
Admission: RE | Disposition: A | Discharge status: Home / Self Care | Payer: Self-pay | Source: Ambulatory Visit | Attending: Ophthalmology

## 2021-11-11 ENCOUNTER — Ambulatory Visit
Admission: RE | Admit: 2021-11-11 | Discharge: 2021-11-11 | Disposition: A | Discharge status: Home / Self Care | Payer: Medicare Other | Source: Ambulatory Visit | Attending: Ophthalmology | Admitting: Ophthalmology

## 2021-11-11 ENCOUNTER — Ambulatory Visit: Payer: Medicare Other | Admitting: Anesthesiology

## 2021-11-11 ENCOUNTER — Other Ambulatory Visit: Payer: Self-pay

## 2021-11-11 ENCOUNTER — Encounter: Payer: Self-pay | Admitting: Ophthalmology

## 2021-11-11 DIAGNOSIS — Z87891 Personal history of nicotine dependence: Secondary | ICD-10-CM | POA: Insufficient documentation

## 2021-11-11 DIAGNOSIS — K219 Gastro-esophageal reflux disease without esophagitis: Secondary | ICD-10-CM | POA: Diagnosis not present

## 2021-11-11 DIAGNOSIS — H2512 Age-related nuclear cataract, left eye: Secondary | ICD-10-CM | POA: Insufficient documentation

## 2021-11-11 DIAGNOSIS — Z9889 Other specified postprocedural states: Secondary | ICD-10-CM | POA: Insufficient documentation

## 2021-11-11 DIAGNOSIS — H269 Unspecified cataract: Secondary | ICD-10-CM | POA: Diagnosis not present

## 2021-11-11 DIAGNOSIS — I10 Essential (primary) hypertension: Secondary | ICD-10-CM | POA: Insufficient documentation

## 2021-11-11 HISTORY — PX: CATARACT EXTRACTION W/PHACO: SHX586

## 2021-11-11 SURGERY — PHACOEMULSIFICATION, CATARACT, WITH IOL INSERTION
Anesthesia: General | Site: Eye | Laterality: Left

## 2021-11-11 MED ORDER — SIGHTPATH DOSE#1 BSS IO SOLN
INTRAOCULAR | Status: DC | PRN
  Administered 2021-11-11: 1 mL via INTRAMUSCULAR

## 2021-11-11 MED ORDER — SIGHTPATH DOSE#1 BSS IO SOLN
INTRAOCULAR | Status: DC | PRN
  Administered 2021-11-11: 75 mL via OPHTHALMIC

## 2021-11-11 MED ORDER — PROPOFOL 10 MG/ML IV BOLUS
INTRAVENOUS | Status: DC | PRN
  Administered 2021-11-11: 100 mg via INTRAVENOUS

## 2021-11-11 MED ORDER — MOXIFLOXACIN HCL 0.5 % OP SOLN
OPHTHALMIC | Status: DC | PRN
  Administered 2021-11-11: 0.2 mL via OPHTHALMIC

## 2021-11-11 MED ORDER — SIGHTPATH DOSE#1 NA CHONDROIT SULF-NA HYALURON 40-17 MG/ML IO SOLN
INTRAOCULAR | Status: DC | PRN
  Administered 2021-11-11: 1 mL via INTRAOCULAR

## 2021-11-11 MED ORDER — SIGHTPATH DOSE#1 BSS IO SOLN
INTRAOCULAR | Status: DC | PRN
  Administered 2021-11-11: 15 mL

## 2021-11-11 MED ORDER — TETRACAINE HCL 0.5 % OP SOLN
1.0000 [drp] | OPHTHALMIC | Status: DC | PRN
  Administered 2021-11-11 (×3): 1 [drp] via OPHTHALMIC

## 2021-11-11 MED ORDER — BRIMONIDINE TARTRATE-TIMOLOL 0.2-0.5 % OP SOLN
OPHTHALMIC | Status: DC | PRN
  Administered 2021-11-11: 1 [drp] via OPHTHALMIC

## 2021-11-11 MED ORDER — ARMC OPHTHALMIC DILATING DROPS
1.0000 | OPHTHALMIC | Status: DC | PRN
  Administered 2021-11-11 (×3): 1 via OPHTHALMIC

## 2021-11-11 SURGICAL SUPPLY — 9 items
CATARACT SUITE SIGHTPATH (MISCELLANEOUS) ×1 IMPLANT
FEE CATARACT SUITE SIGHTPATH (MISCELLANEOUS) ×1 IMPLANT
GLOVE SURG ENC TEXT LTX SZ8 (GLOVE) ×1 IMPLANT
GLOVE SURG TRIUMPH 8.0 PF LTX (GLOVE) ×1 IMPLANT
LENS IOL TECNIS EYHANCE 18.0 (Intraocular Lens) IMPLANT
NDL FILTER BLUNT 18X1 1/2 (NEEDLE) ×1 IMPLANT
NEEDLE FILTER BLUNT 18X1 1/2 (NEEDLE) ×1 IMPLANT
SYR 3ML LL SCALE MARK (SYRINGE) ×1 IMPLANT
WATER STERILE IRR 250ML POUR (IV SOLUTION) ×1 IMPLANT

## 2021-11-11 NOTE — H&P (Signed)
[facility]   Primary Care Physician:  [facility] Ophthalmologist: Dr. George Ina  Pre-Procedure History & Physical: HPI:  Drew Mendoza is a 71 y.o. male here for cataract surgery.   Past Medical History:  Diagnosis Date   GERD (gastroesophageal reflux disease)    Hypertension    Seizure ([place]) 1985   none since    Past Surgical History:  Procedure Laterality Date   BRAIN SURGERY     CATARACT EXTRACTION W/PHACO Right 10/28/2021   Procedure: CATARACT EXTRACTION PHACO AND INTRAOCULAR LENS PLACEMENT (IOC) RIGHT VISION BLUE 24.17 01:47.4;  Surgeon: Birder Robson, MD;  Location: [facility];  Service: Ophthalmology;  Laterality: Right;    Prior to Admission medications   Medication Sig Start Date End Date Taking? Authorizing Provider  amLODipine (NORVASC) 10 MG tablet Take 10 mg by mouth daily.   Yes [provider]  atorvastatin (LIPITOR) 80 MG tablet Take 80 mg by mouth daily.   Yes [provider]  lisinopril-hydrochlorothiazide (ZESTORETIC) 10-12.5 MG tablet Take 1 tablet by mouth daily.   Yes [provider]  omeprazole (PRILOSEC) 40 MG capsule Take 40 mg by mouth daily.   Yes [provider]    Allergies as of 10/02/2021   (No Known Allergies)    History reviewed. No pertinent family history.  Social History   Socioeconomic History   Marital status: Single    Spouse name: Not on file   Number of children: Not on file   Years of education: Not on file   Highest education level: Not on file  Occupational History   Not on file  Tobacco Use   Smoking status: Former    Types: Cigarettes    Quit date: 10/16/2021    Years since quitting: 0.0   Smokeless tobacco: Never  Vaping Use   Vaping Use: Never used  Substance and Sexual Activity   Alcohol use: Yes    Alcohol/week: 3.0 standard drinks of alcohol    Types: 3 Cans of beer per week    Comment: weekend   Drug use: Never   Sexual activity:  Not on file  Other Topics Concern   Not on file  Social History Narrative   Not on file   Social Determinants of Health   Financial Resource Strain: Not on file  Food Insecurity: Not on file  Transportation Needs: Not on file  Physical Activity: Not on file  Stress: Not on file  Social Connections: Not on file  Intimate Partner Violence: Not on file    Review of Systems: See HPI, otherwise negative ROS  Physical Exam: BP 137/77   Pulse 75   Temp 99.3 F (37.4 C) (Oral)   Resp 13   Ht 5\' 9"  (1.753 m)   Wt 65.3 kg   SpO2 98%   BMI 21.27 kg/m  General:   Alert, cooperative in NAD Head:  Normocephalic and atraumatic. Respiratory:  Normal work of breathing. Cardiovascular:  RRR  Impression/Plan: Drew Mendoza is here for cataract surgery.  Risks, benefits, limitations, and alternatives regarding cataract surgery have been reviewed with the patient.  Questions have been answered.  All parties agreeable.   Birder Robson, MD  11/11/2021, 2:16 PM

## 2021-11-11 NOTE — Anesthesia Postprocedure Evaluation (Signed)
Anesthesia Post Note  Patient: Drew Mendoza  Procedure(s) Performed: CATARACT EXTRACTION PHACO AND INTRAOCULAR LENS PLACEMENT (IOC) LEFT  10.80 00:54.8 (Left: Eye)  Patient location during evaluation: PACU Anesthesia Type: General Level of consciousness: awake and alert Pain management: pain level controlled Vital Signs Assessment: post-procedure vital signs reviewed and stable Respiratory status: spontaneous breathing, nonlabored ventilation and respiratory function stable Cardiovascular status: blood pressure returned to baseline and stable Postop Assessment: no apparent nausea or vomiting Anesthetic complications: no   No notable events documented.   Last Vitals:  Vitals:   11/11/21 1453 11/11/21 1503  BP: 112/76 132/84  Pulse: 72 65  Resp: 19 13  Temp: (!) 36.4 C (!) 36.2 C  SpO2: 100% 99%    Last Pain:  Vitals:   11/11/21 1503  TempSrc:   PainSc: 0-No pain                 Iran Ouch

## 2021-11-11 NOTE — Anesthesia Preprocedure Evaluation (Signed)
Anesthesia Evaluation  Patient identified by MRN, date of birth, ID band Patient awake    Reviewed: Allergy & Precautions, NPO status , Patient's Chart, lab work & pertinent test results  History of Anesthesia Complications Negative for: history of anesthetic complications  Airway Mallampati: III  TM Distance: >3 FB Neck ROM: full    Dental  (+) Dental Advidsory Given   Pulmonary neg pulmonary ROS, neg shortness of breath, neg COPD, Patient abstained from smoking., former smoker   Pulmonary exam normal        Cardiovascular hypertension, (-) Past MI and (-) CABG Normal cardiovascular exam     Neuro/Psych negative neurological ROS  negative psych ROS   GI/Hepatic Neg liver ROS,GERD  Controlled and Medicated,,  Endo/Other  negative endocrine ROS    Renal/GU negative Renal ROS  negative genitourinary   Musculoskeletal   Abdominal   Peds  Hematology negative hematology ROS (+)   Anesthesia Other Findings Past Medical History: No date: GERD (gastroesophageal reflux disease) No date: Hypertension 1985: Seizure ([place])     Comment:  none since  Past Surgical History: No date: BRAIN SURGERY  BMI    Body Mass Index: 21.37 kg/m      Reproductive/Obstetrics negative OB ROS                             Anesthesia Physical Anesthesia Plan  ASA: 2  Anesthesia Plan: General   Post-op Pain Management:    Induction: Intravenous  PONV Risk Score and Plan: 1 and Ondansetron  Airway Management Planned: LMA  Additional Equipment:   Intra-op Plan:   Post-operative Plan:   Informed Consent:      Dental Advisory Given  Plan Discussed with: Anesthesiologist, CRNA and Surgeon  Anesthesia Plan Comments: (Patient consented for risks of anesthesia including but not limited to:  - adverse reactions to medications - damage to eyes, teeth, lips or other oral mucosa - nerve damage due to  positioning  - sore throat or hoarseness - Damage to heart, brain, nerves, lungs, other parts of body or loss of life  Patient voiced understanding.)        Anesthesia Quick Evaluation

## 2021-11-11 NOTE — Anesthesia Procedure Notes (Signed)
Procedure Name: LMA Insertion Date/Time: 11/11/2021 2:20 PM  Performed by: Tobie Poet, CRNAPre-anesthesia Checklist: Patient identified, Emergency Drugs available, Suction available and Patient being monitored Patient Re-evaluated:Patient Re-evaluated prior to induction Oxygen Delivery Method: Circle system utilized Preoxygenation: Pre-oxygenation with 100% oxygen Induction Type: IV induction Ventilation: Mask ventilation without difficulty LMA: LMA inserted LMA Size: 4.0 Tube type: Oral Number of attempts: 1 Airway Equipment and Method: Oral airway Placement Confirmation: positive ETCO2 and breath sounds checked- equal and bilateral Tube secured with: Tape Dental Injury: Teeth and Oropharynx as per pre-operative assessment

## 2021-11-11 NOTE — Op Note (Signed)
PREOPERATIVE DIAGNOSIS:  Nuclear sclerotic cataract of the left eye.   POSTOPERATIVE DIAGNOSIS:  Nuclear sclerotic cataract of the left eye.   OPERATIVE PROCEDURE:ORPROCALL@   SURGEON:  Drew Robson, MD.   ANESTHESIA:  Anesthesiologist: Iran Ouch, MD CRNA: Tobie Poet, CRNA  1.      Managed anesthesia care.  GA by LMA 2.     0.82ml of Shugarcaine was instilled following the paracentesis   COMPLICATIONS:  None.   TECHNIQUE:   Stop and chop   DESCRIPTION OF PROCEDURE:  The patient was examined and consented in the preoperative holding area where the aforementioned topical anesthesia was applied to the left eye and then brought back to the Operating Room where the left eye was prepped and draped in the usual sterile ophthalmic fashion and a lid speculum was placed. A paracentesis was created with the side port blade and the anterior chamber was filled with viscoelastic. A near clear corneal incision was performed with the steel keratome. A continuous curvilinear capsulorrhexis was performed with a cystotome followed by the capsulorrhexis forceps. Hydrodissection and hydrodelineation were carried out with BSS on a blunt cannula. The lens was removed in a stop and chop  technique and the remaining cortical material was removed with the irrigation-aspiration handpiece. The capsular bag was inflated with viscoelastic and the Technis [id number] lens was placed in the capsular bag without complication. The remaining viscoelastic was removed from the eye with the irrigation-aspiration handpiece. The wounds were hydrated. The anterior chamber was flushed with BSS and the eye was inflated to physiologic pressure. 0.31ml Vigamox was placed in the anterior chamber. The wounds were found to be water tight. The eye was dressed with Combigan. The patient was given protective glasses to wear throughout the day and a shield with which to sleep tonight. The patient was also given drops with which to  begin a drop regimen today and will follow-up with me in one day. Implant Name Type Inv. Item Serial No. Manufacturer Lot No. LRB No. Used Action  LENS IOL TECNIS EYHANCE 18.0 - [id number] Intraocular Lens LENS IOL TECNIS EYHANCE 18.0 [id number] [employer]  Left 1 Implanted    Procedure(s): CATARACT EXTRACTION PHACO AND INTRAOCULAR LENS PLACEMENT (IOC) LEFT  10.80 00:54.8 (Left)  Electronically signed: Birder Mendoza 11/11/2021 2:46 PM

## 2021-11-11 NOTE — Transfer of Care (Signed)
Immediate Anesthesia Transfer of Care Note  Patient: Drew Mendoza  Procedure(s) Performed: CATARACT EXTRACTION PHACO AND INTRAOCULAR LENS PLACEMENT (IOC) LEFT  10.80 00:54.8 (Left: Eye)  Patient Location: PACU  Anesthesia Type: General  Level of Consciousness: awake, alert  and patient cooperative  Airway and Oxygen Therapy: Patient Spontanous Breathing and Patient connected to supplemental oxygen  Post-op Assessment: Post-op Vital signs reviewed, Patient's Cardiovascular Status Stable, Respiratory Function Stable, Patent Airway and No signs of Nausea or vomiting  Post-op Vital Signs: Reviewed and stable  Complications: No notable events documented.

## 2022-03-23 ENCOUNTER — Ambulatory Visit: Payer: 59 | Admitting: Physician Assistant

## 2022-04-01 ENCOUNTER — Encounter: Payer: Self-pay | Admitting: Physician Assistant

## 2022-04-01 ENCOUNTER — Ambulatory Visit: Payer: 59 | Admitting: Physician Assistant

## 2022-04-01 DIAGNOSIS — K219 Gastro-esophageal reflux disease without esophagitis: Secondary | ICD-10-CM | POA: Insufficient documentation

## 2022-04-01 DIAGNOSIS — K429 Umbilical hernia without obstruction or gangrene: Secondary | ICD-10-CM | POA: Insufficient documentation

## 2022-04-01 DIAGNOSIS — F1021 Alcohol dependence, in remission: Secondary | ICD-10-CM | POA: Insufficient documentation

## 2022-04-01 DIAGNOSIS — F32A Depression, unspecified: Secondary | ICD-10-CM | POA: Insufficient documentation

## 2022-04-01 DIAGNOSIS — Z720 Tobacco use: Secondary | ICD-10-CM | POA: Insufficient documentation

## 2022-04-01 DIAGNOSIS — N529 Male erectile dysfunction, unspecified: Secondary | ICD-10-CM | POA: Insufficient documentation

## 2022-04-01 DIAGNOSIS — I10 Essential (primary) hypertension: Secondary | ICD-10-CM | POA: Insufficient documentation

## 2022-04-01 DIAGNOSIS — E785 Hyperlipidemia, unspecified: Secondary | ICD-10-CM | POA: Insufficient documentation

## 2022-04-01 DIAGNOSIS — M1990 Unspecified osteoarthritis, unspecified site: Secondary | ICD-10-CM | POA: Insufficient documentation

## 2022-04-06 DIAGNOSIS — R519 Headache, unspecified: Secondary | ICD-10-CM | POA: Diagnosis not present

## 2022-04-06 DIAGNOSIS — I10 Essential (primary) hypertension: Secondary | ICD-10-CM | POA: Diagnosis not present

## 2022-04-06 DIAGNOSIS — R410 Disorientation, unspecified: Secondary | ICD-10-CM | POA: Diagnosis not present

## 2022-04-06 DIAGNOSIS — E785 Hyperlipidemia, unspecified: Secondary | ICD-10-CM | POA: Diagnosis not present

## 2022-04-06 DIAGNOSIS — S0091XA Abrasion of unspecified part of head, initial encounter: Secondary | ICD-10-CM | POA: Diagnosis not present

## 2022-04-06 DIAGNOSIS — S0990XA Unspecified injury of head, initial encounter: Secondary | ICD-10-CM | POA: Diagnosis not present

## 2022-04-06 DIAGNOSIS — F17210 Nicotine dependence, cigarettes, uncomplicated: Secondary | ICD-10-CM | POA: Diagnosis not present
